# Patient Record
Sex: Male | Born: 1975 | Race: White | Hispanic: No | Marital: Single | Smoking: Former smoker
Health system: Southern US, Community
[De-identification: ages and names within clinical notes are randomized; demographics above are authoritative.]

---

## 2005-04-15 ENCOUNTER — Emergency Department: Payer: Self-pay | Admitting: Emergency Medicine

## 2005-04-27 ENCOUNTER — Emergency Department: Payer: Self-pay | Admitting: Emergency Medicine

## 2007-03-05 ENCOUNTER — Emergency Department: Payer: Self-pay | Admitting: Emergency Medicine

## 2007-03-12 ENCOUNTER — Ambulatory Visit: Payer: Self-pay | Admitting: Specialist

## 2008-02-29 IMAGING — CR DG KNEE COMPLETE 4+V*R*
1 series · 4 of 4 positions shown · non-contrast
Comparison: none

REASON FOR EXAM: injury
COMMENTS:

[Series 1: view not recorded · 0.17mm/px · 4 of 4 slices shown]
[im 1/4]
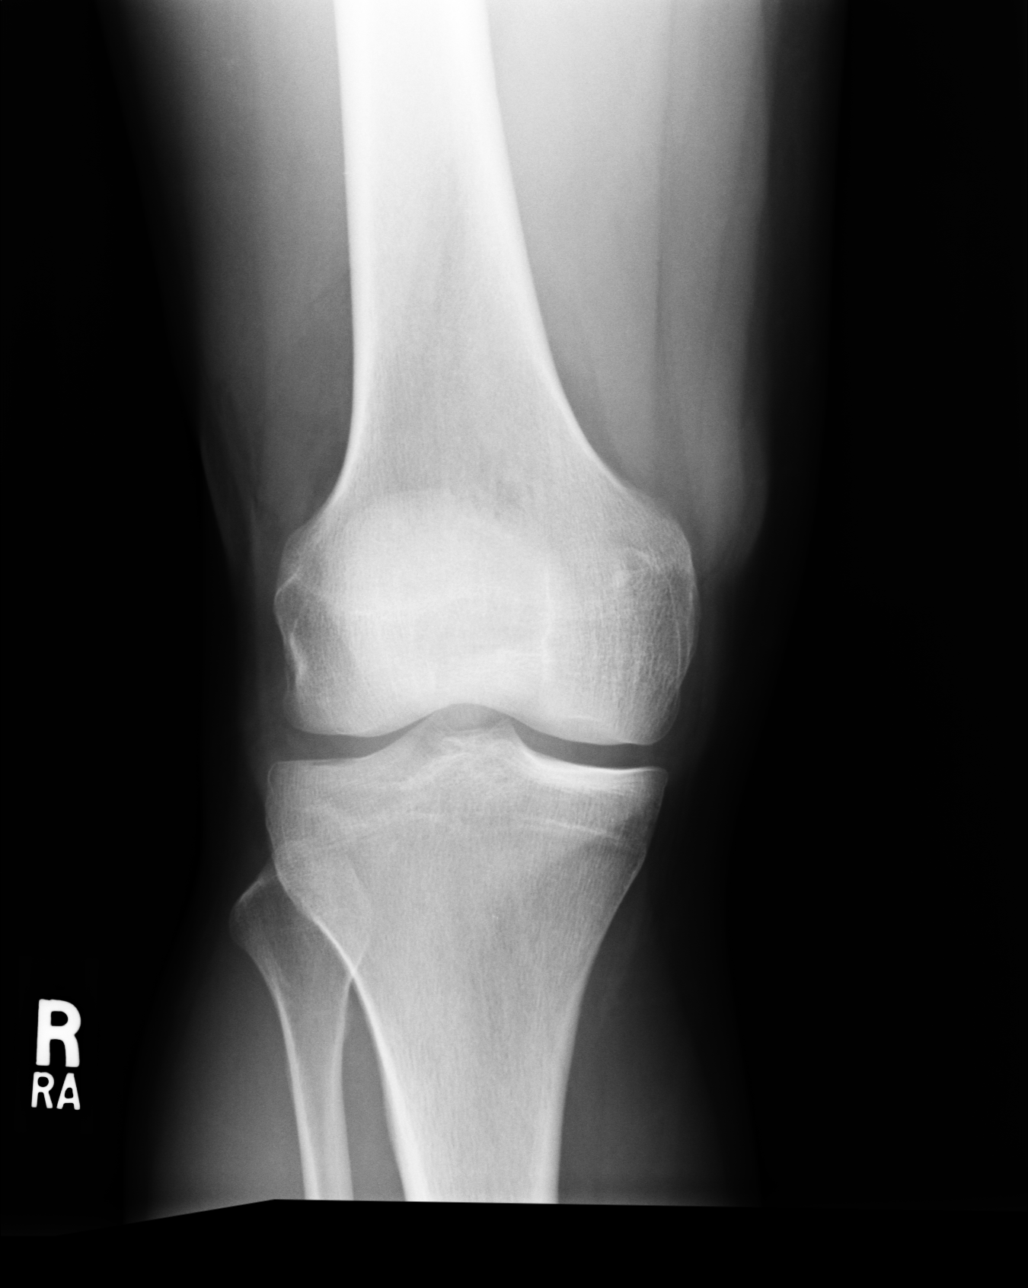
[im 2/4]
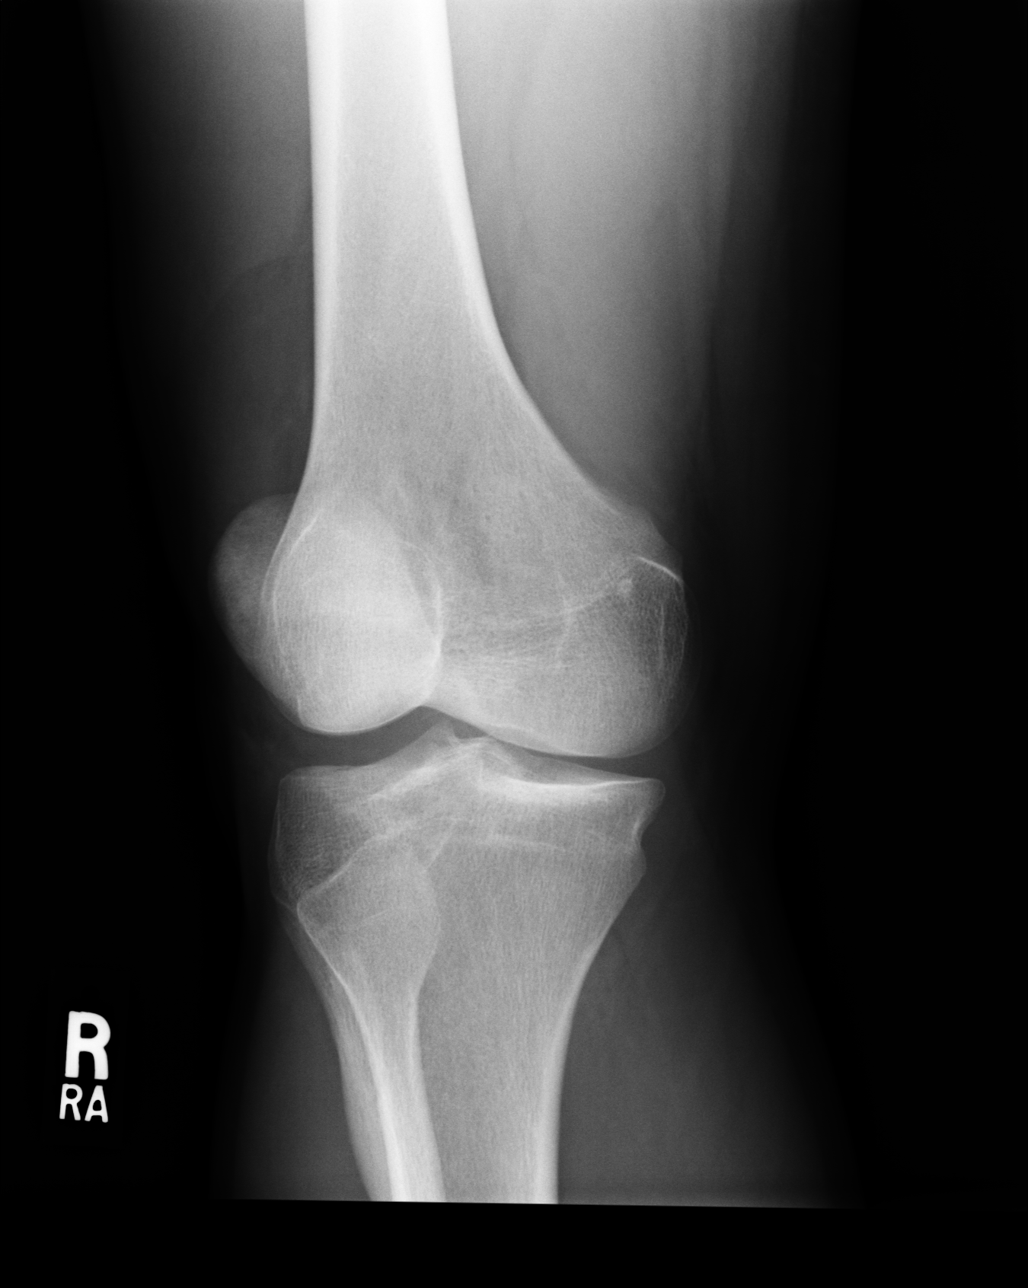
[im 3/4]
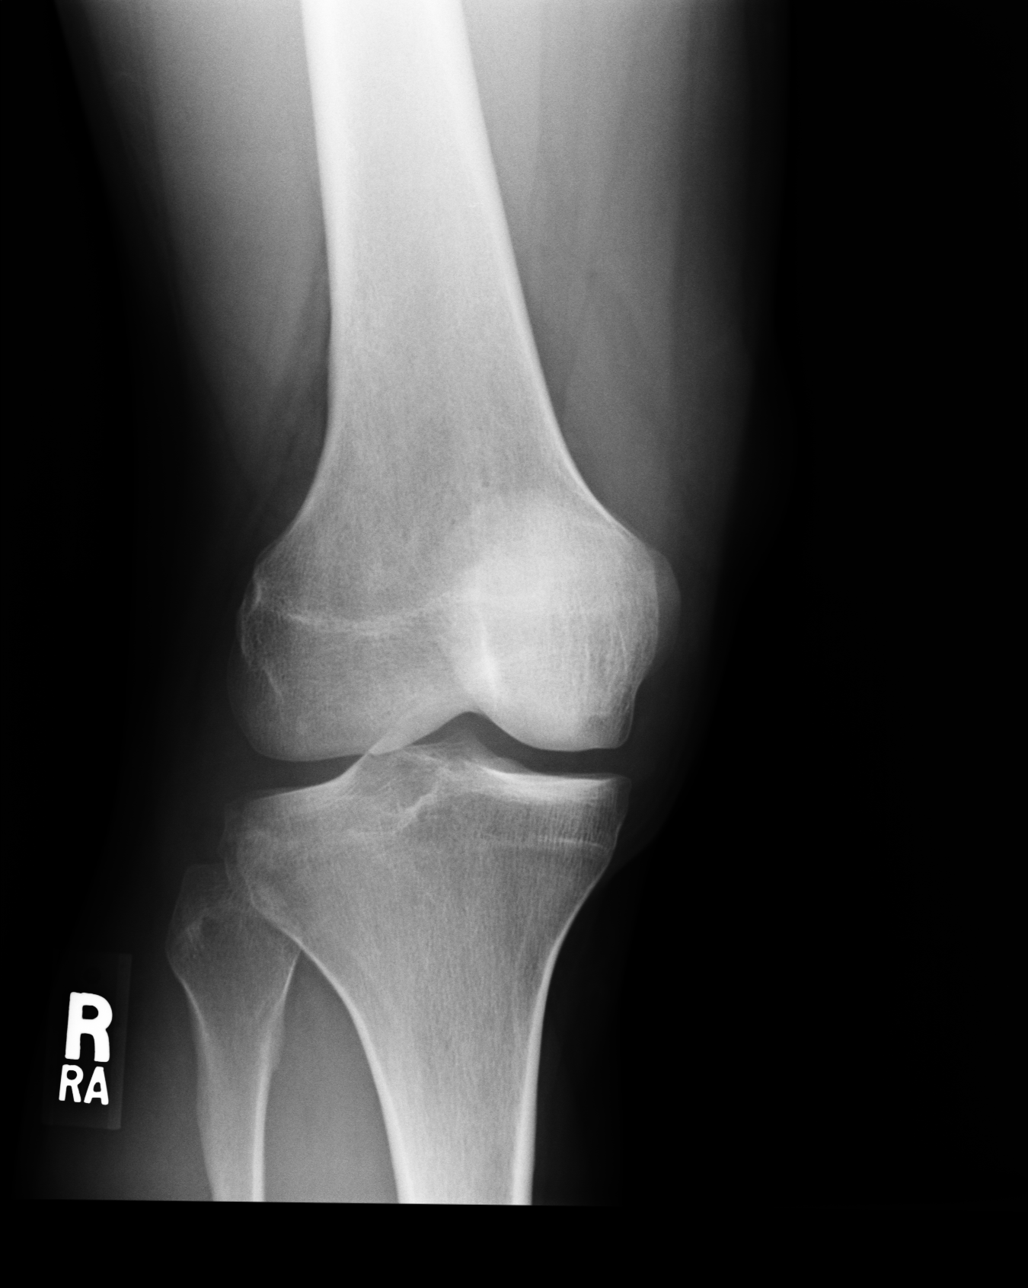
[im 4/4]
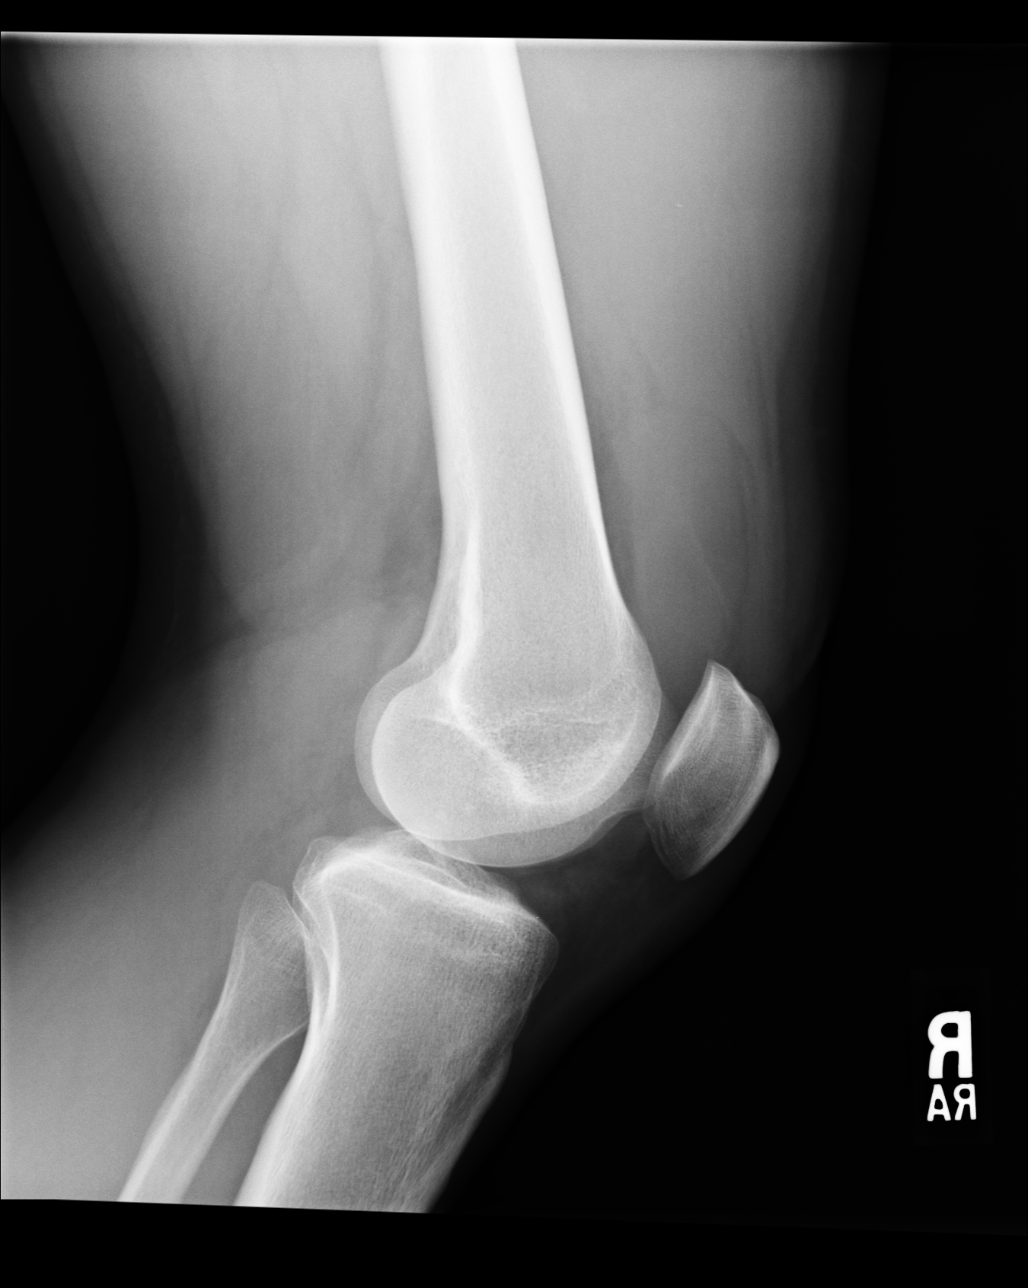

[4 of 4 positions shown; findings below may reference images not displayed]

PROCEDURE:     DXR - DXR KNEE RT COMP WITH OBLIQUES  - March 06, 2007 [DATE]

RESULT:     The bones of the knee appear adequately mineralized. There is no
acute fracture. There may be a small joint effusion. There is a tiny lucency
that is seen projecting over the lateral tibial spine. This is of uncertain
significance.  A definite area of cortical disruption is not seen.
IMPRESSION: 1. I see no acute displaced fracture. Subtle lucency over the lateral tibial
spine is noted. The possibility of this reflecting a nondisplaced fracture
is raised. Consideration could be given for performing a CT scan of the
RIGHT knee.
2. There is a joint effusion.

## 2010-06-06 ENCOUNTER — Emergency Department: Payer: Self-pay | Admitting: Emergency Medicine

## 2011-06-01 IMAGING — CR RIGHT ANKLE - COMPLETE 3+ VIEW
1 series · 5 of 5 positions shown · non-contrast
Comparison: none

REASON FOR EXAM: pain/swelling/injury..pt in [facility]
COMMENTS:   May transport without cardiac monitor

[Series 1: view not recorded · 0.17mm/px · 5 of 5 slices shown]
[im 1/5]
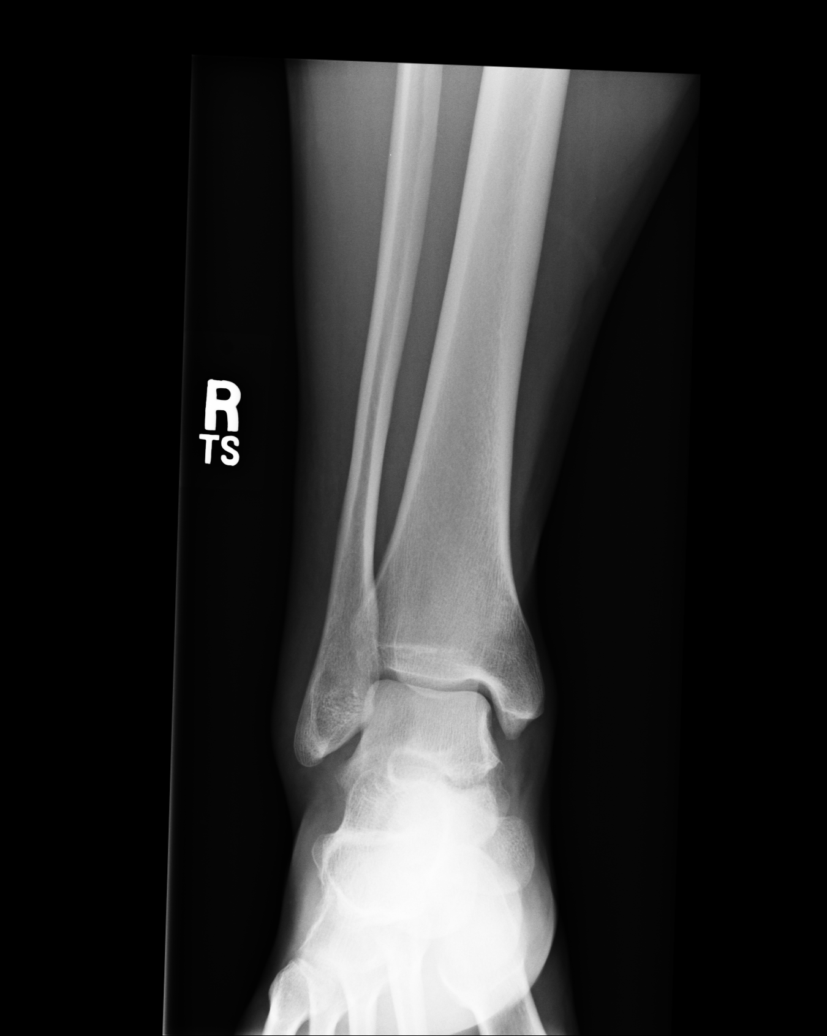
[im 2/5]
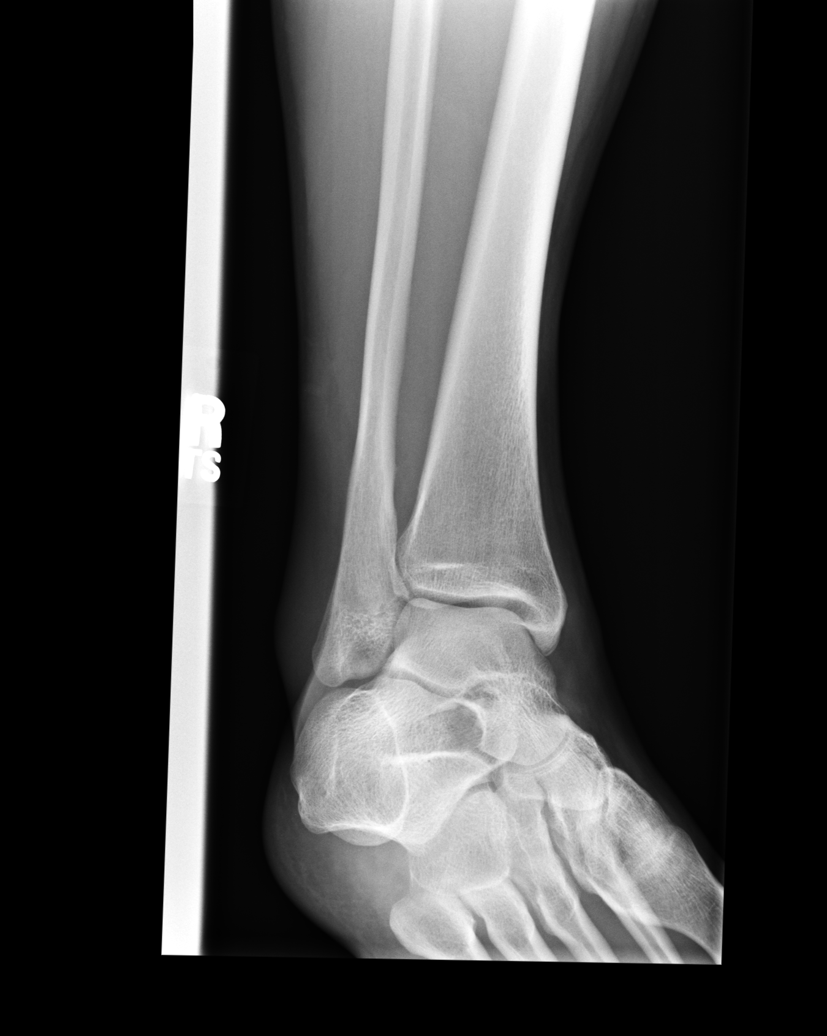
[im 3/5]
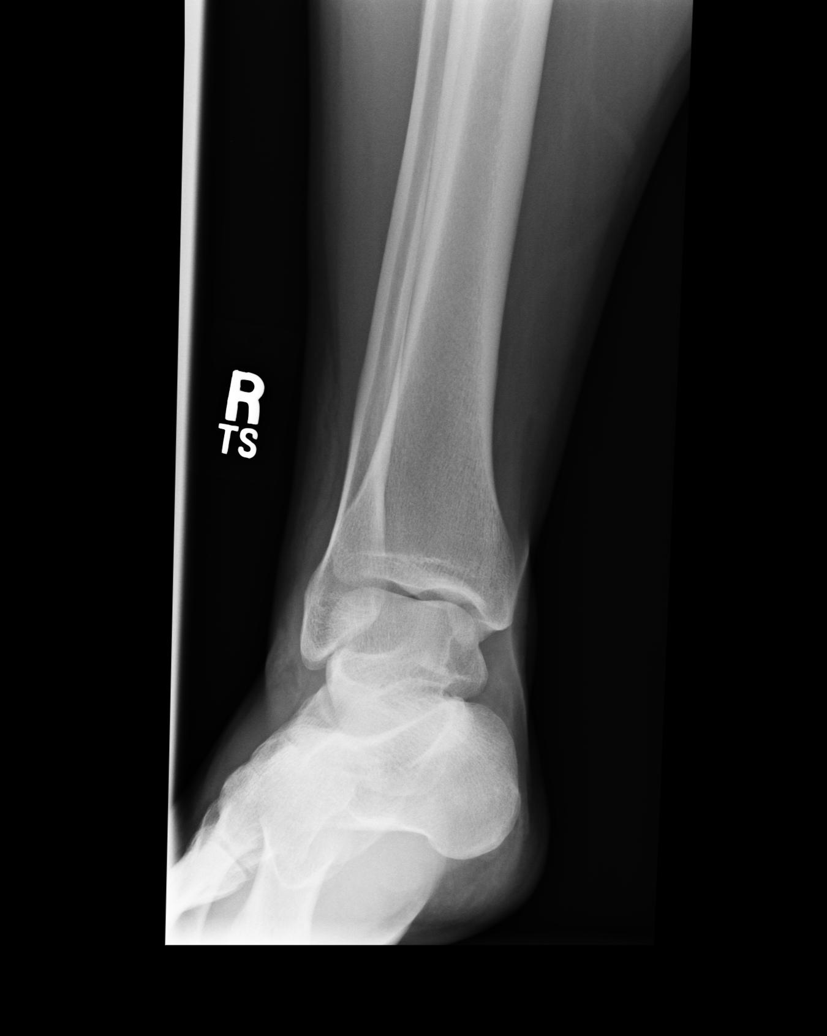
[im 4/5]
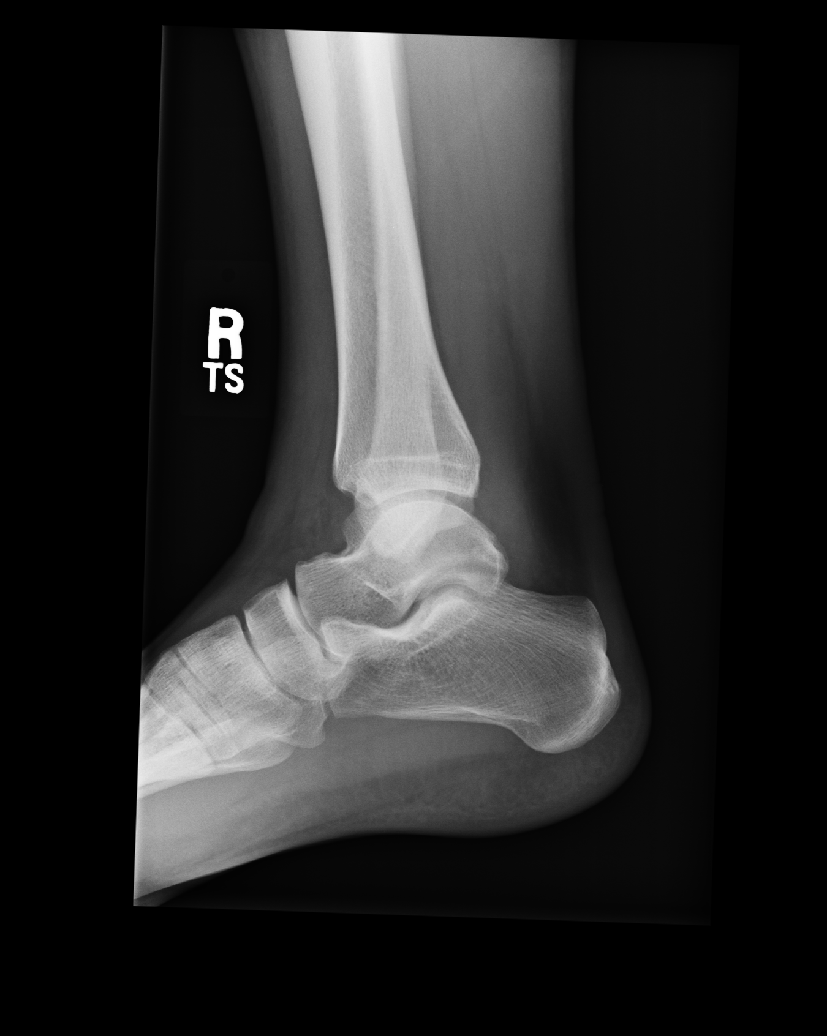
[im 5/5]
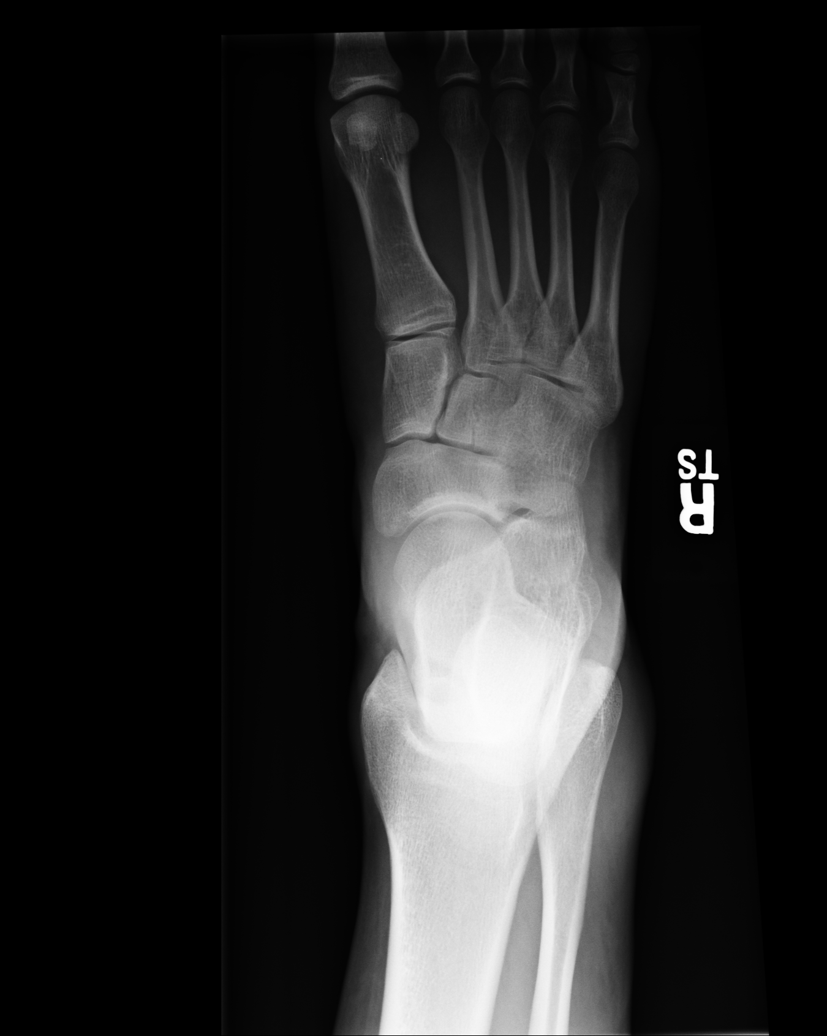

[5 of 5 positions shown; findings below may reference images not displayed]

PROCEDURE:     DXR - DXR ANKLE RIGHT COMPLETE  - June 06, 2010 [DATE]

RESULT:     No fracture, dislocation or other acute bony abnormality about
the ankle is seen. The ankle mortise is well-maintained. There is
incidentally noted a lucency projected over the proximal portion of the
second cuneiform bone consistent with a prominent trabecular marking or
artifact produced by the medial margin of the third cuneiform.
IMPRESSION: No acute bony abnormalities are identified.

## 2017-12-26 ENCOUNTER — Encounter: Payer: Self-pay | Admitting: *Deleted

## 2017-12-26 ENCOUNTER — Other Ambulatory Visit: Payer: Self-pay

## 2017-12-26 ENCOUNTER — Ambulatory Visit
Admission: EM | Admit: 2017-12-26 | Discharge: 2017-12-26 | Disposition: A | Discharge status: Home / Self Care | Payer: BLUE CROSS/BLUE SHIELD | Attending: Family Medicine | Admitting: Family Medicine

## 2017-12-26 DIAGNOSIS — A084 Viral intestinal infection, unspecified: Secondary | ICD-10-CM | POA: Diagnosis not present

## 2017-12-26 MED ORDER — ONDANSETRON 8 MG PO TBDP: 8.0000 mg | ORAL_TABLET | Freq: Three times a day (TID) | ORAL | 0 refills | Status: AC | PRN

## 2017-12-26 NOTE — ED Triage Notes (Signed)
Patient started having symptoms of nausea and diarrhea 2 days ago.

## 2017-12-26 NOTE — ED Provider Notes (Signed)
[facility]    CSN: [id number] Arrival date & time: 12/26/17  1031     History   Chief Complaint Chief Complaint  Patient presents with  . Nausea  . Diarrhea    HPI Cameron Pollard is a [age] y.o. male.   The history is provided by the patient.  Diarrhea  Quality:  Watery Severity:  Moderate Onset quality:  Sudden Number of episodes:  10 Duration:  2 days Timing:  Intermittent Progression:  Unchanged Relieved by:  None tried Ineffective treatments:  None tried Associated symptoms: no abdominal pain, no arthralgias, no chills, no recent cough, no diaphoresis, no fever, no headaches, no myalgias, no URI and no vomiting   Risk factors: no recent antibiotic use, no sick contacts, no suspicious food intake and no travel to endemic areas     History reviewed. No pertinent past medical history.  There are no active problems to display for this patient.   History reviewed. No pertinent surgical history.     Home Medications    Prior to Admission medications   Medication Sig Start Date End Date Taking? Authorizing Provider  ondansetron (ZOFRAN ODT) 8 MG disintegrating tablet Take 1 tablet (8 mg total) by mouth every 8 (eight) hours as needed. 12/26/17   Payton Mccallumonty, Talita Recht, MD    Family History Family History  Problem Relation Age of Onset  . Healthy Mother     Social History Social History   Tobacco Use  . Smoking status: Current Every Day Smoker    Packs/day: 0.50    Types: Cigarettes  . Smokeless tobacco: Never Used  Substance Use Topics  . Alcohol use: Yes  . Drug use: Never     Allergies   Patient has no known allergies.   Review of Systems Review of Systems  Constitutional: Negative for chills, diaphoresis and fever.  Gastrointestinal: Positive for diarrhea. Negative for abdominal pain and vomiting.  Musculoskeletal: Negative for arthralgias and myalgias.  Neurological: Negative for headaches.     Physical Exam Triage Vital  Signs ED Triage Vitals  Enc Vitals Group     BP 12/26/17 1047 124/79     Pulse Rate 12/26/17 1047 80     Resp 12/26/17 1047 16     Temp 12/26/17 1047 98.7 F (37.1 C)     Temp Source 12/26/17 1047 Oral     SpO2 12/26/17 1047 99 %     Weight 12/26/17 1048 175 lb (79.4 kg)     Height 12/26/17 1048 5\' 7"  (1.702 m)     Head Circumference --      Peak Flow --      Pain Score 12/26/17 1048 0     Pain Loc --      Pain Edu? --      Excl. in GC? --    No data found.  Updated Vital Signs BP 124/79 (BP Location: Left Arm)   Pulse 80   Temp 98.7 F (37.1 C) (Oral)   Resp 16   Ht 5\' 7"  (1.702 m)   Wt 175 lb (79.4 kg)   SpO2 99%   BMI 27.41 kg/m   Visual Acuity Right Eye Distance:   Left Eye Distance:   Bilateral Distance:    Right Eye Near:   Left Eye Near:    Bilateral Near:     Physical Exam  Constitutional: He is oriented to person, place, and time. He appears well-developed and well-nourished. No distress.  HENT:  Head: Normocephalic and atraumatic.  Cardiovascular: Normal rate and regular rhythm.  Pulmonary/Chest: Effort normal and breath sounds normal. No respiratory distress.  Abdominal: Soft. Bowel sounds are normal. He exhibits no distension and no mass. There is no tenderness. There is no rebound and no guarding.  Neurological: He is alert and oriented to person, place, and time.  Skin: No rash noted. He is not diaphoretic.  Nursing note and vitals reviewed.    UC Treatments / Results  Labs (all labs ordered are listed, but only abnormal results are displayed) Labs Reviewed - No data to display  EKG  EKG Interpretation None       Radiology No results found.  Procedures Procedures (including critical care time)  Medications Ordered in UC Medications - No data to display   Initial Impression / Assessment and Plan / UC Course  I have reviewed the triage vital signs and the nursing notes.  Pertinent labs & imaging results that were available  during my care of the patient were reviewed by me and considered in my medical decision making (see chart for details).       Final Clinical Impressions(s) / UC Diagnoses   Final diagnoses:  Viral gastroenteritis    ED Discharge Orders        Ordered    ondansetron (ZOFRAN ODT) 8 MG disintegrating tablet  Every 8 hours PRN     12/26/17 1148     1. diagnosis reviewed with patient 2. rx as per orders above; reviewed possible side effects, interactions, risks and benefits  3. Recommend supportive treatment with rest, clear liquids then advance slowly as tolerated 4. Follow-up prn if symptoms worsen or don't improve   Controlled Substance Prescriptions [place] Controlled Substance Registry consulted? Not Applicable   Payton Mccallum, MD 12/26/17 1155

## 2020-07-09 ENCOUNTER — Other Ambulatory Visit: Payer: Self-pay

## 2020-07-09 ENCOUNTER — Ambulatory Visit
Admission: EM | Admit: 2020-07-09 | Discharge: 2020-07-09 | Disposition: A | Discharge status: Home / Self Care | Payer: BC Managed Care – PPO | Attending: Physician Assistant | Admitting: Physician Assistant

## 2020-07-09 DIAGNOSIS — M791 Myalgia, unspecified site: Secondary | ICD-10-CM | POA: Diagnosis present

## 2020-07-09 DIAGNOSIS — Z87891 Personal history of nicotine dependence: Secondary | ICD-10-CM | POA: Diagnosis not present

## 2020-07-09 DIAGNOSIS — R059 Cough, unspecified: Secondary | ICD-10-CM | POA: Diagnosis not present

## 2020-07-09 DIAGNOSIS — U071 COVID-19: Secondary | ICD-10-CM | POA: Diagnosis not present

## 2020-07-09 DIAGNOSIS — B349 Viral infection, unspecified: Secondary | ICD-10-CM

## 2020-07-09 NOTE — ED Provider Notes (Signed)
[facility]    CSN: [id number] Arrival date & time: 07/09/20  1059      History   Chief Complaint Chief Complaint  Patient presents with  . Chills  . Generalized Body Aches    HPI Cameron Pollard is a 44 y.o. male presenting for 1 week history of cough, nasal and sinus congestion, fatigue, chills and body aches.  Patient denies any known Covid exposure and is not vaccinated for Covid.  He says he believes he had a fever but did not recorded temperature.  Patient says that he started to feel better each day, but is now concerned since his son started have symptoms.  He says he does not want to give the child back to his mother if he has Covid.  Patient has been taking over-the-counter Tylenol for symptoms.  He denies any worsening symptoms and again states that he is feeling better.  He is supposed to go back to work tomorrow.  He has no other concerns today.  HPI  History reviewed. No pertinent past medical history.  There are no problems to display for this patient.   History reviewed. No pertinent surgical history.     Home Medications    Prior to Admission medications   Medication Sig Start Date End Date Taking? Authorizing Provider  ondansetron (ZOFRAN ODT) 8 MG disintegrating tablet Take 1 tablet (8 mg total) by mouth every 8 (eight) hours as needed. 12/26/17   Payton Mccallum, MD    Family History Family History  Problem Relation Age of Onset  . Healthy Mother     Social History Social History   Tobacco Use  . Smoking status: Former Smoker    Packs/day: 0.50    Types: Cigarettes    Quit date: 07/09/2018    Years since quitting: 2.0  . Smokeless tobacco: Never Used  Vaping Use  . Vaping Use: Never used  Substance Use Topics  . Alcohol use: Yes    Comment: social  . Drug use: Never     Allergies   Patient has no known allergies.   Review of Systems Review of Systems  Constitutional: Positive for chills and fatigue. Negative for  fever.  HENT: Positive for congestion and rhinorrhea. Negative for sinus pressure, sinus pain and sore throat.   Respiratory: Positive for cough. Negative for shortness of breath.   Gastrointestinal: Negative for abdominal pain, diarrhea, nausea and vomiting.  Musculoskeletal: Positive for myalgias.  Neurological: Negative for weakness, light-headedness and headaches.  Hematological: Negative for adenopathy.     Physical Exam Triage Vital Signs ED Triage Vitals  Enc Vitals Group     BP 07/09/20 1111 131/89     Pulse Rate 07/09/20 1111 91     Resp 07/09/20 1111 17     Temp 07/09/20 1111 99.7 F (37.6 C)     Temp Source 07/09/20 1111 Oral     SpO2 07/09/20 1111 96 %     Weight 07/09/20 1110 182 lb (82.6 kg)     Height 07/09/20 1110 5\' 7"  (1.702 m)     Head Circumference --      Peak Flow --      Pain Score 07/09/20 1110 0     Pain Loc --      Pain Edu? --      Excl. in GC? --    No data found.  Updated Vital Signs BP 131/89 (BP Location: Left Arm)   Pulse 91   Temp 99.7 F (37.6  C) (Oral)   Resp 17   Ht 5\' 7"  (1.702 m)   Wt 182 lb (82.6 kg)   SpO2 96%   BMI 28.51 kg/m      Physical Exam Vitals and nursing note reviewed.  Constitutional:      General: He is not in acute distress.    Appearance: Normal appearance. He is well-developed. He is not toxic-appearing or diaphoretic.  HENT:     Head: Normocephalic and atraumatic.     Right Ear: Tympanic membrane, ear canal and external ear normal.     Left Ear: Tympanic membrane, ear canal and external ear normal.     Nose: Congestion present.     Mouth/Throat:     Pharynx: Uvula midline. Posterior oropharyngeal erythema present. No oropharyngeal exudate.     Tonsils: No tonsillar abscesses.  Eyes:     General: No scleral icterus.       Right eye: No discharge.        Left eye: No discharge.     Conjunctiva/sclera: Conjunctivae normal.     Pupils: Pupils are equal, round, and reactive to light.  Neck:      Thyroid: No thyromegaly.     Trachea: No tracheal deviation.  Cardiovascular:     Rate and Rhythm: Normal rate and regular rhythm.     Heart sounds: Normal heart sounds.  Pulmonary:     Effort: Pulmonary effort is normal. No respiratory distress.     Breath sounds: Normal breath sounds. No stridor. No wheezing or rales.  Chest:     Chest wall: No tenderness.  Musculoskeletal:     Cervical back: Normal range of motion and neck supple.  Lymphadenopathy:     Cervical: No cervical adenopathy.  Skin:    General: Skin is warm and dry.     Findings: No rash.  Neurological:     Mental Status: He is alert.  Psychiatric:        Mood and Affect: Mood normal.        Behavior: Behavior normal.        Thought Content: Thought content normal.      UC Treatments / Results  Labs (all labs ordered are listed, but only abnormal results are displayed) Labs Reviewed  SARS CORONAVIRUS 2 (TAT 6-24 HRS)    EKG   Radiology No results found.  Procedures Procedures (including critical care time)  Medications Ordered in UC Medications - No data to display  Initial Impression / Assessment and Plan / UC Course  I have reviewed the triage vital signs and the nursing notes.  Pertinent labs & imaging results that were available during my care of the patient were reviewed by me and considered in my medical decision making (see chart for details).   Covid testing obtained today.  Advised patient how to access results.  CDC guidelines, isolation protocol and ED precautions discussed if Covid positive.  Advised to continue over-the-counter medication, rest and fluids.  Advised that if he is positive he will have to be out of work for the full 10 days from symptoms start.  For any worsening symptoms including fever, worsening cough, breathing difficulty he should follow-up with our department or ER.  Patient understanding.  Final Clinical Impressions(s) / UC Diagnoses   Final diagnoses:  Viral  illness  Cough  Myalgia     Discharge Instructions     URI/COLD SYMPTOMS: Your exam today is consistent with a viral illness. Antibiotics are not indicated at this time.  Use medications as directed, including cough syrup, nasal saline, and decongestants. Your symptoms should improve over the next few days and resolve within 7-10 days. Increase rest and fluids. F/u if symptoms worsen or predominate such as sore throat, ear pain, productive cough, shortness of breath, or if you develop high fevers or worsening fatigue over the next several days.    You have received COVID testing today either for positive exposure, concerning symptoms that could be related to COVID infection, screening purposes, or re-testing after confirmed positive.  Your test obtained today checks for active viral infection in the last 1-2 weeks. If your test is negative now, you can still test positive later. So, if you do develop symptoms you should either get re-tested and/or isolate x 10 days. Please follow CDC guidelines.  While Rapid antigen tests come back in 15-20 minutes, send out PCR/molecular test results typically come back within 24 hours. In the mean time, if you are symptomatic, assume this could be a positive test and treat/monitor yourself as if you do have COVID.   We will call with test results. Please download the MyChart app and set up a profile to access test results.   If symptomatic, go home and rest. Push fluids. Take Tylenol as needed for discomfort. Gargle warm salt water. Throat lozenges. Take Mucinex DM or Robitussin for cough. Humidifier in bedroom to ease coughing. Warm showers. Also review the COVID handout for more information.  COVID-19 INFECTION: The incubation period of COVID-19 is approximately 14 days after exposure, with most symptoms developing in roughly 4-5 days. Symptoms may range in severity from mild to critically severe. Roughly 80% of those infected will have mild symptoms. People of  any age may become infected with COVID-19 and have the ability to transmit the virus. The most common symptoms include: fever, fatigue, cough, body aches, headaches, sore throat, nasal congestion, shortness of breath, nausea, vomiting, diarrhea, changes in smell and/or taste.    COURSE OF ILLNESS Some patients may begin with mild disease which can progress quickly into critical symptoms. If your symptoms are worsening please call ahead to the Emergency Department and proceed there for further treatment. Recovery time appears to be roughly 1-2 weeks for mild symptoms and 3-6 weeks for severe disease.   GO IMMEDIATELY TO ER FOR FEVER YOU ARE UNABLE TO GET DOWN WITH TYLENOL, BREATHING PROBLEMS, CHEST PAIN, FATIGUE, LETHARGY, INABILITY TO EAT OR DRINK, ETC  QUARANTINE AND ISOLATION: To help decrease the spread of COVID-19 please remain isolated if you have COVID infection or are highly suspected to have COVID infection. This means -stay home and isolate to one room in the home if you live with others. Do not share a bed or bathroom with others while ill, sanitize and wipe down all countertops and keep common areas clean and disinfected. You may discontinue isolation if you have a mild case and are asymptomatic 10 days after symptom onset as long as you have been fever free >24 hours without having to take Motrin or Tylenol. If your case is more severe (meaning you develop pneumonia or are admitted in the hospital), you may have to isolate longer.   If you have been in close contact (within 6 feet) of someone diagnosed with COVID 19, you are advised to quarantine in your home for 14 days as symptoms can develop anywhere from 2-14 days after exposure to the virus. If you develop symptoms, you  must isolate.  Most current guidelines for COVID after exposure -isolate  10 days if you ARE NOT tested for COVID as long as symptoms do not develop -isolate 7 days if you are tested and remain asymptomatic -You do not  necessarily need to be tested for COVID if you have + exposure and        develop   symptoms. Just isolate at home x10 days from symptom onset During this global pandemic, CDC advises to practice social distancing, try to stay at least 676ft away from others at all times. Wear a face covering. Wash and sanitize your hands regularly and avoid going anywhere that is not necessary.  KEEP IN MIND THAT THE COVID TEST IS NOT 100% ACCURATE AND YOU SHOULD STILL DO EVERYTHING TO PREVENT POTENTIAL SPREAD OF VIRUS TO OTHERS (WEAR MASK, WEAR GLOVES, WASH HANDS AND SANITIZE REGULARLY). IF INITIAL TEST IS NEGATIVE, THIS MAY NOT MEAN YOU ARE DEFINITELY NEGATIVE. MOST ACCURATE TESTING IS DONE 5-7 DAYS AFTER EXPOSURE.   It is not advised by CDC to get re-tested after receiving a positive COVID test since you can still test positive for weeks to months after you have already cleared the virus.   *If you have not been vaccinated for COVID, I strongly suggest you consider getting vaccinated as long as there are no contraindications.      ED Prescriptions    None     PDMP not reviewed this encounter.   Shirlee Latchaves, Kara Melching B, PA-C 07/09/20 1145

## 2020-07-09 NOTE — Discharge Instructions (Addendum)

## 2020-07-09 NOTE — ED Triage Notes (Signed)
Pt with cough, sinus congestion, fatigue, chills and bodyaches starting Tuesday

## 2020-07-10 LAB — SARS CORONAVIRUS 2 (TAT 6-24 HRS): SARS Coronavirus 2: POSITIVE — AB

## 2020-07-11 ENCOUNTER — Telehealth: Payer: Self-pay | Admitting: Infectious Diseases

## 2020-07-11 NOTE — Telephone Encounter (Signed)
Called to Discuss with patient about Covid symptoms and the use of the monoclonal antibody infusion for those with mild to moderate Covid symptoms and at a high risk of hospitalization.     Pt appears to qualify for this infusion due to co-morbid conditions and/or a member of an at-risk group in accordance with the FDA Emergency Use Authorization.    Unable to reach pt - voicemail is full. Mychart sent   Per ER report his symptoms started 9/26 Qualifies for infusion due to BMI 28   Rexene Alberts, MSN, NP-C [facility]  [email or web address]
